# Patient Record
Sex: Male | Born: 1995 | Hispanic: Yes | Marital: Single | Smoking: Never smoker
Health system: Southern US, Community
[De-identification: ages and names within clinical notes are randomized; demographics above are authoritative.]

## PROBLEM LIST (undated history)

## (undated) DIAGNOSIS — E079 Disorder of thyroid, unspecified: Secondary | ICD-10-CM

---

## 2016-07-19 ENCOUNTER — Encounter (HOSPITAL_COMMUNITY): Payer: Self-pay

## 2016-07-19 ENCOUNTER — Emergency Department (HOSPITAL_COMMUNITY)
Admission: EM | Admit: 2016-07-19 | Discharge: 2016-07-19 | Disposition: A | Discharge status: Home / Self Care | Payer: Self-pay | Attending: Emergency Medicine | Admitting: Emergency Medicine

## 2016-07-19 DIAGNOSIS — H10211 Acute toxic conjunctivitis, right eye: Secondary | ICD-10-CM | POA: Insufficient documentation

## 2016-07-19 DIAGNOSIS — T65891A Toxic effect of other specified substances, accidental (unintentional), initial encounter: Secondary | ICD-10-CM | POA: Insufficient documentation

## 2016-07-19 MED ORDER — TETRACAINE HCL 0.5 % OP SOLN
2.0000 [drp] | Freq: Once | OPHTHALMIC | Status: AC
  Administered 2016-07-19: 2 [drp] via OPHTHALMIC
  Filled 2016-07-19: qty 4

## 2016-07-19 MED ORDER — FLUORESCEIN SODIUM 1 MG OP STRP
1.0000 | ORAL_STRIP | Freq: Once | OPHTHALMIC | Status: AC
  Administered 2016-07-19: 1 via OPHTHALMIC
  Filled 2016-07-19: qty 1

## 2016-07-19 MED ORDER — TETANUS-DIPHTH-ACELL PERTUSSIS 5-2.5-18.5 LF-MCG/0.5 IM SUSP
0.5000 mL | Freq: Once | INTRAMUSCULAR | Status: AC
  Administered 2016-07-19: 0.5 mL via INTRAMUSCULAR
  Filled 2016-07-19: qty 0.5

## 2016-07-19 MED ORDER — ERYTHROMYCIN 5 MG/GM OP OINT
TOPICAL_OINTMENT | Freq: Once | OPHTHALMIC | Status: AC
  Administered 2016-07-19: 1 via OPHTHALMIC
  Filled 2016-07-19: qty 3.5

## 2016-07-19 MED ORDER — ERYTHROMYCIN 5 MG/GM OP OINT: TOPICAL_OINTMENT | OPHTHALMIC | 0 refills | Status: DC

## 2016-07-19 NOTE — ED Triage Notes (Signed)
Pt presents with c/o eye problem. Pt reports that he was at work and got some chemicals in his eye when the products "exploded". Pt reports he has accelerant and super glue in his eye. Pt reports his eye is burning at this time. Pt's eye is red.

## 2016-07-19 NOTE — ED Notes (Signed)
Pt does not wear contact lenses or glasses

## 2016-07-19 NOTE — ED Provider Notes (Signed)
[facility]-EMERGENCY DEPT Provider Note   CSN: [id number] Arrival date & time: 07/19/16  1701     History   Chief Complaint Chief Complaint  Patient presents with  . Eye Problem    HPI Daniel Peters is a 20 y.o. male.  Daniel Peters is a 20 y.o. male with no pertinent past medical hx presents to ED with chemical exposure to eye. Patient reports he works for [employer] and was working with [employer] at work when it splashed into his right eye. He was not wearing safety glasses. Denies contact lenses or glasses use. He complains of eye redness, blurry vision, eye pain, watery eye discharge, and photophobia. He feels as though there is glue in his right eye. Patient also complains of mild left eye irritation and redness; he is unsure if any of the chemical material got into the left eye. He used an eyewash station at work for approximately 5-10 minutes with minimal relief. He denies fever, trouble swallowing, difficulty breathing, HA, LOC, abdominal pain, vomiting. Unknown last tetanus. No immunocompromising conditions.       History reviewed. No pertinent past medical history.  There are no active problems to display for this patient.   History reviewed. No pertinent surgical history.     Home Medications    Prior to Admission medications   Medication Sig Start Date End Date Taking? Authorizing Provider  erythromycin ophthalmic ointment Place a 1/2 inch ribbon of ointment into the lower eyelid four times daily 07/19/16   Lona KettleAshley Laurel Issaiah Seabrooks, PA-C    Family History No family history on file.  Social History Social History  Substance Use Topics  . Smoking status: Never Smoker  . Smokeless tobacco: Not on file  . Alcohol use No     Allergies   Patient has no known allergies.   Review of Systems Review of Systems  Constitutional: Negative for chills, diaphoresis and fever.  HENT: Negative for trouble swallowing.   Eyes: Positive for photophobia, pain, discharge,  redness and visual disturbance.  Respiratory: Negative for shortness of breath.   Cardiovascular: Negative for chest pain.  Gastrointestinal: Negative for abdominal pain, nausea and vomiting.  Genitourinary: Negative for dysuria and hematuria.  Musculoskeletal: Negative for neck pain.  Skin: Negative for rash.  Neurological: Negative for syncope and headaches.     Physical Exam Updated Vital Signs BP 126/70   Pulse 69   Temp 98 F (36.7 C) (Oral)   Resp 17   SpO2 97%   Physical Exam  Constitutional: He appears well-developed and well-nourished. No distress.  HENT:  Head: Normocephalic and atraumatic.  Mouth/Throat: Oropharynx is clear and moist. No oropharyngeal exudate.  Eyes: EOM are normal. Pupils are equal, round, and reactive to light. Right eye exhibits chemosis. Right eye exhibits no discharge and no hordeolum. Foreign body present in the right eye. Left eye exhibits no chemosis, no discharge and no hordeolum. No foreign body present in the left eye. Right conjunctiva is injected. Left conjunctiva is injected. Left conjunctiva has no hemorrhage. No scleral icterus.  Visual acuity is 20/40 bl/l, 20/100 R, 20/25 L uncorrected. No periorbital swelling. No proptosis. No eye lid lacerations. Lids are soft. No TTP or bony defects of orbital rim. PERRL.  Right eye: Conjunctival injection, chemosis, PH is 7-8. Three fine specs noted on cornea. On fluorescein stain - three fine specs noted; no abrasions, ulcerations, or dendritic lesions. No seidel sign.  Left eye: Conjunctival injection. PH 7-8.   Neck: Normal range of motion and  phonation normal. Neck supple. No neck rigidity. Normal range of motion present.  Cardiovascular: Normal rate, regular rhythm, normal heart sounds and intact distal pulses.   No murmur heard. Pulmonary/Chest: Effort normal and breath sounds normal. No stridor. No respiratory distress. He has no wheezes. He has no rales.  Abdominal: Soft. Bowel sounds are  normal. He exhibits no distension. There is no tenderness. There is no rigidity, no rebound, no guarding and no CVA tenderness.  Musculoskeletal: Normal range of motion.  Lymphadenopathy:    He has no cervical adenopathy.  Neurological: He is alert. He is not disoriented. Coordination and gait normal. GCS eye subscore is 4. GCS verbal subscore is 5. GCS motor subscore is 6.  Skin: Skin is warm and dry. He is not diaphoretic.  Psychiatric: He has a normal mood and affect. His behavior is normal.     ED Treatments / Results  Labs (all labs ordered are listed, but only abnormal results are displayed) Labs Reviewed - No data to display  EKG  EKG Interpretation None       Radiology No results found.  Procedures Procedures (including critical care time)  Medications Ordered in ED Medications  tetracaine (PONTOCAINE) 0.5 % ophthalmic solution 2 drop (2 drops Right Eye Given 07/19/16 1733)  fluorescein ophthalmic strip 1 strip (1 strip Right Eye Given 07/19/16 1733)  Tdap (BOOSTRIX) injection 0.5 mL (0.5 mLs Intramuscular Given 07/19/16 2053)  erythromycin ophthalmic ointment (1 application Right Eye Given 07/19/16 2052)     Initial Impression / Assessment and Plan / ED Course  I have reviewed the triage vital signs and the nursing notes.  Pertinent labs & imaging results that were available during my care of the patient were reviewed by me and considered in my medical decision making (see chart for details).  Clinical Course    Patient presents to ED with complaint of chemical exposure to eye. Patient is afebrile and non-toxic appearing in NAD. VSS. 20/40 b/l, 20/25 L, 20/100 R. PERRL. EOMs intact. B/l conjunctival injection R>L. No proptosis. Ph 7-8 b/l. Right chemosis. Three fine specs noted on right cornea - given sharp demarcation of specs suspect foreign body (instabond accelerator); no obvious corneal abrasion, ulceration, or dendritic lesions; no seidel sign. Given concern  for foreign body, and that the material is glue and very small will consult ophthalmology regarding removal. and noted to be glue  Spoke with poison control, recommend copious irrigation. Shared visit with Dr. Dalene SeltzerSchlossman, agrees with plan.   6:45 PM: Spoke with Dr. Allena KatzPatel. Start patient on erythromycin ointment and follow up in office on Monday @ 8:30am.   B/l eye irrigation performed with morgan lens. Improvement in ph noted. Pt states feels as though foreign bodies have moved into conjunctival sac. On re-assessment foreign body no longer present on cornea. Able to remove foreign bodies with relief to patient. Tetanus updated. Rx erythromycin ointment. Follow with ophthalmology on Monday. Return precautions given. Pt voiced understanding and is agreeable.   Final Clinical Impressions(s) / ED Diagnoses   Final diagnoses:  Chemical conjunctivitis of right eye    New Prescriptions Discharge Medication List as of 07/19/2016  8:43 PM    START taking these medications   Details  erythromycin ophthalmic ointment Place a 1/2 inch ribbon of ointment into the lower eyelid four times daily, Print         Lona Kettleshley Laurel Moneka Mcquinn, PA-C 07/20/16 1126    Alvira MondayErin Schlossman, MD 07/21/16 2122

## 2016-07-19 NOTE — Discharge Instructions (Signed)
Read the information below.  You had a chemical exposure to your eye. You are being prescribed topical antibiotics for your eye. Please use as directed.  You are scheduled to see the eye doctor on Monday at 8:30am, address and contact information is provided above.  Use the prescribed medication as directed.  Please discuss all new medications with your pharmacist.   You can take tylenol or motrin for pain relief.  You may return to the Emergency Department at any time for worsening condition or any new symptoms that concern you. Return to ED if symptoms worsen, have worsening changes in vision, unable to move eye, have purulent discharge, or any other new/concerning symptoms.

## 2017-08-05 ENCOUNTER — Observation Stay (HOSPITAL_COMMUNITY): Payer: 59

## 2017-08-05 ENCOUNTER — Observation Stay (HOSPITAL_COMMUNITY)
Admission: EM | Admit: 2017-08-05 | Discharge: 2017-08-06 | Disposition: A | Discharge status: Home / Self Care | Payer: 59 | Attending: Internal Medicine | Admitting: Internal Medicine

## 2017-08-05 DIAGNOSIS — R748 Abnormal levels of other serum enzymes: Secondary | ICD-10-CM

## 2017-08-05 DIAGNOSIS — R197 Diarrhea, unspecified: Secondary | ICD-10-CM | POA: Insufficient documentation

## 2017-08-05 DIAGNOSIS — R42 Dizziness and giddiness: Secondary | ICD-10-CM | POA: Insufficient documentation

## 2017-08-05 DIAGNOSIS — E860 Dehydration: Secondary | ICD-10-CM | POA: Insufficient documentation

## 2017-08-05 DIAGNOSIS — R112 Nausea with vomiting, unspecified: Secondary | ICD-10-CM | POA: Diagnosis not present

## 2017-08-05 DIAGNOSIS — R569 Unspecified convulsions: Secondary | ICD-10-CM | POA: Diagnosis not present

## 2017-08-05 DIAGNOSIS — R17 Unspecified jaundice: Secondary | ICD-10-CM

## 2017-08-05 DIAGNOSIS — Z7982 Long term (current) use of aspirin: Secondary | ICD-10-CM | POA: Diagnosis not present

## 2017-08-05 LAB — CBC
HEMATOCRIT: 45.4 % (ref 39.0–52.0)
HEMOGLOBIN: 16.1 g/dL (ref 13.0–17.0)
MCH: 31.3 pg (ref 26.0–34.0)
MCHC: 35.5 g/dL (ref 30.0–36.0)
MCV: 88.3 fL (ref 78.0–100.0)
Platelets: 200 10*3/uL (ref 150–400)
RBC: 5.14 MIL/uL (ref 4.22–5.81)
RDW: 12.5 % (ref 11.5–15.5)
WBC: 12.4 10*3/uL — ABNORMAL HIGH (ref 4.0–10.5)

## 2017-08-05 LAB — BASIC METABOLIC PANEL
ANION GAP: 10 (ref 5–15)
BUN: 19 mg/dL (ref 6–20)
CHLORIDE: 102 mmol/L (ref 101–111)
CO2: 25 mmol/L (ref 22–32)
Calcium: 9.4 mg/dL (ref 8.9–10.3)
Creatinine, Ser: 1.26 mg/dL — ABNORMAL HIGH (ref 0.61–1.24)
GFR calc Af Amer: >60 mL/min (ref >60)
GFR calc non Af Amer: >60 mL/min (ref >60)
GLUCOSE: 114 mg/dL — AB (ref 65–99)
Potassium: 4.7 mmol/L (ref 3.5–5.1)
Sodium: 137 mmol/L (ref 135–145)

## 2017-08-05 LAB — RAPID URINE DRUG SCREEN, HOSP PERFORMED
Amphetamines: NOT DETECTED
Barbiturates: NOT DETECTED
Benzodiazepines: POSITIVE — AB
Cocaine: NOT DETECTED
Opiates: NOT DETECTED
Tetrahydrocannabinol: NOT DETECTED

## 2017-08-05 LAB — HEPATIC FUNCTION PANEL
ALT: 97 U/L — ABNORMAL HIGH (ref 17–63)
AST: 47 U/L — ABNORMAL HIGH (ref 15–41)
Albumin: 4.9 g/dL (ref 3.5–5.0)
Alkaline Phosphatase: 92 U/L (ref 38–126)
Bilirubin, Direct: 0.3 mg/dL (ref 0.1–0.5)
Indirect Bilirubin: 2.2 mg/dL — ABNORMAL HIGH (ref 0.3–0.9)
Total Bilirubin: 2.5 mg/dL — ABNORMAL HIGH (ref 0.3–1.2)
Total Protein: 8.4 g/dL — ABNORMAL HIGH (ref 6.5–8.1)

## 2017-08-05 LAB — LIPASE, BLOOD: Lipase: 24 U/L (ref 11–51)

## 2017-08-05 LAB — TROPONIN I: Troponin I: <0.03 ng/mL (ref <0.03)

## 2017-08-05 LAB — CBG MONITORING, ED: GLUCOSE-CAPILLARY: 95 mg/dL (ref 65–99)

## 2017-08-05 MED ORDER — ONDANSETRON HCL 4 MG/2ML IJ SOLN: 4.0000 mg | Freq: Four times a day (QID) | INTRAMUSCULAR | Status: DC | PRN

## 2017-08-05 MED ORDER — ACETAMINOPHEN 650 MG RE SUPP: 650.0000 mg | Freq: Four times a day (QID) | RECTAL | Status: DC | PRN

## 2017-08-05 MED ORDER — LEVETIRACETAM 500 MG/5ML IV SOLN
1500.0000 mg | Freq: Once | INTRAVENOUS | Status: AC
  Administered 2017-08-05: 1500 mg via INTRAVENOUS
  Filled 2017-08-05: qty 15

## 2017-08-05 MED ORDER — PANTOPRAZOLE SODIUM 40 MG IV SOLR
40.0000 mg | Freq: Two times a day (BID) | INTRAVENOUS | Status: DC
  Administered 2017-08-05 – 2017-08-06 (×2): 40 mg via INTRAVENOUS
  Filled 2017-08-05 (×2): qty 40

## 2017-08-05 MED ORDER — SODIUM CHLORIDE 0.9 % IV SOLN
INTRAVENOUS | Status: DC
  Administered 2017-08-05: 1000 mL via INTRAVENOUS
  Administered 2017-08-06: 11:00:00 via INTRAVENOUS

## 2017-08-05 MED ORDER — ACETAMINOPHEN 325 MG PO TABS: 650.0000 mg | ORAL_TABLET | Freq: Four times a day (QID) | ORAL | Status: DC | PRN

## 2017-08-05 MED ORDER — ENOXAPARIN SODIUM 40 MG/0.4ML ~~LOC~~ SOLN
40.0000 mg | SUBCUTANEOUS | Status: DC
  Administered 2017-08-05: 40 mg via SUBCUTANEOUS
  Filled 2017-08-05: qty 0.4

## 2017-08-05 MED ORDER — LORAZEPAM 2 MG/ML IJ SOLN
INTRAMUSCULAR | Status: AC
  Filled 2017-08-05: qty 1

## 2017-08-05 MED ORDER — LORAZEPAM 2 MG/ML IJ SOLN
2.0000 mg | Freq: Once | INTRAMUSCULAR | Status: AC
  Administered 2017-08-05: 2 mg via INTRAVENOUS

## 2017-08-05 MED ORDER — IOPAMIDOL (ISOVUE-370) INJECTION 76%
INTRAVENOUS | Status: AC
  Administered 2017-08-05: 100 mL
  Filled 2017-08-05: qty 100

## 2017-08-05 MED ORDER — AMMONIA AROMATIC IN INHA
0.3000 mL | Freq: Once | RESPIRATORY_TRACT | Status: AC
  Administered 2017-08-05: 0.3 mL via RESPIRATORY_TRACT
  Filled 2017-08-05: qty 10

## 2017-08-05 MED ORDER — ONDANSETRON HCL 4 MG PO TABS: 4.0000 mg | ORAL_TABLET | Freq: Four times a day (QID) | ORAL | Status: DC | PRN

## 2017-08-05 NOTE — ED Notes (Signed)
Nurse drawing labs. 

## 2017-08-05 NOTE — Consult Note (Signed)
NEURO HOSPITALIST CONSULT NOTE   Requestig physician: Dr. Jacqulyn BathLong   Reason for Consult: Multiple seizures   History obtained from: Patient's girlfriend and ER HPI:                                                                                                                                          Daniel ParrDaniel Peters is an 21 y.o. male with no past medical history who states that he does not take any benzodiazepines, does not drink, does not do any illicit drugs, has no history of seizures, has no history of abnormal birth or head trauma.  Patient works as a [employer]at[employer].  Today he was working and noted that he began breathing heavily and felt like he was in a pass out the sat down.  At that point apparently he had a seizure which was witnessed by coworkers.  He had multiple seizures in route and per girlfriend were described as bilateral clonic with eyes closed but not responsive.  While in the ED patient had bilateral clonic seizure but eyes were open and did not respond to sternal rub.  In talking to the patient he states that he can hear people while he is having the seizure and he feels a seizure coming on when he breathes heavily.  There was one seizure in which she did not have a prolonged or even a postictal state while other seizures may be had a longer postictal state.  Currently he is awake and alert oriented and able to follow commands.  Of note patient and wife state he has been under significant stress at home due to finances, his job, and recently moving into a new apartment along with 2 children.  No past medical history on file.  No past surgical history on file.  No family history on file.    Social History:  reports that  has never smoked. He does not have any smokeless tobacco history on file. He reports that he does not drink alcohol or use drugs.  No Known Allergies  MEDICATIONS:                                                                                                                      Current Facility-Administered Medications  Medication Dose Route Frequency  Provider Last Rate Last Dose  . LORazepam (ATIVAN) 2 MG/ML injection            Current Outpatient Medications  Medication Sig Dispense Refill  . ibuprofen (ADVIL,MOTRIN) 200 MG tablet Take 200 mg by mouth every 6 (six) hours as needed.    Marland Kitchen erythromycin ophthalmic ointment Place a 1/2 inch ribbon of ointment into the lower eyelid four times daily (Patient not taking: Reported on 08/05/2017) 3.5 g 0      ROS:                                                                                                                                       History obtained from the patient  General ROS: negative for - chills, fatigue, fever, night sweats, weight gain or weight loss Psychological ROS: negative for - behavioral disorder, hallucinations, memory difficulties, mood swings or suicidal ideation Ophthalmic ROS: negative for - blurry vision, double vision, eye pain or loss of vision ENT ROS: negative for - epistaxis, nasal discharge, oral lesions, sore throat, tinnitus or vertigo Allergy and Immunology ROS: negative for - hives or itchy/watery eyes Hematological and Lymphatic ROS: negative for - bleeding problems, bruising or swollen lymph nodes Endocrine ROS: negative for - galactorrhea, hair pattern changes, polydipsia/polyuria or temperature intolerance Respiratory ROS: negative for - cough, hemoptysis, shortness of breath or wheezing Cardiovascular ROS: negative for - chest pain, dyspnea on exertion, edema or irregular heartbeat Gastrointestinal ROS: negative for - abdominal pain, diarrhea, hematemesis, nausea/vomiting or stool incontinence Genito-Urinary ROS: negative for - dysuria, hematuria, incontinence or urinary frequency/urgency Musculoskeletal ROS: negative for - joint swelling or muscular weakness Neurological ROS: as noted in HPI Dermatological ROS: negative for rash and  skin lesion changes   Blood pressure 111/73, pulse (!) 109, temperature 98.9 F (37.2 C), temperature source Oral, resp. rate (!) 22, height 5\' 9"  (1.753 m), weight 98.4 kg (217 lb), SpO2 98 %.   Neurologic Examination:                                                                                                      HEENT-  Normocephalic, no lesions, without obvious abnormality.  Normal external eye and conjunctiva.  Normal TM's bilaterally.  Normal auditory canals and external ears. Normal external nose, mucus membranes and septum.  Normal pharynx. Cardiovascular- S1, S2 normal, pulses palpable throughout   Lungs- chest clear, no wheezing, rales, normal symmetric air entry Abdomen- normal findings: bowel sounds normal Extremities- no edema  Lymph-no adenopathy palpable Musculoskeletal-no joint tenderness, deformity or swelling Skin-warm and dry, no hyperpigmentation, vitiligo, or suspicious lesions  Neurological Examination Mental Status: Alert, oriented, thought content appropriate.  Speech fluent without evidence of aphasia.  Able to follow 3 step commands without difficulty. Cranial Nerves: II: Discs flat bilaterally; Visual fields grossly normal,  III,IV, VI: ptosis not present, extra-ocular motions intact bilaterally pupils equal, round, reactive to light and accommodation V,VII: smile symmetric, facial light touch sensation normal bilaterally VIII: hearing normal bilaterally IX,X: uvula rises symmetrically XI: bilateral shoulder shrug XII: midline tongue extension Motor: Right : Upper extremity   5/5    Left:     Upper extremity   5/5  Lower extremity   5/5     Lower extremity   5/5 Tone and bulk:normal tone throughout; no atrophy noted Sensory: Pinprick and light touch intact throughout, bilaterally Deep Tendon Reflexes: 2+ and symmetric throughout Plantars: Right: downgoing   Left: downgoing Cerebellar: normal finger-to-nose, normal rapid alternating movements and  normal heel-to-shin test Gait: Not tested      Lab Results: Basic Metabolic Panel: Recent Labs  Lab 08/05/17 1407  NA 137  K 4.7  CL 102  CO2 25  GLUCOSE 114*  BUN 19  CREATININE 1.26*  CALCIUM 9.4    Liver Function Tests: Recent Labs  Lab 08/05/17 1407  AST 47*  ALT 97*  ALKPHOS 92  BILITOT 2.5*  PROT 8.4*  ALBUMIN 4.9   Recent Labs  Lab 08/05/17 1407  LIPASE 24   No results for input(s): AMMONIA in the last 168 hours.  CBC: Recent Labs  Lab 08/05/17 1407  WBC 12.4*  HGB 16.1  HCT 45.4  MCV 88.3  PLT 200    Cardiac Enzymes: No results for input(s): CKTOTAL, CKMB, CKMBINDEX, TROPONINI in the last 168 hours.  Lipid Panel: No results for input(s): CHOL, TRIG, HDL, CHOLHDL, VLDL, LDLCALC in the last 168 hours.  CBG: Recent Labs  Lab 08/05/17 1503  GLUCAP 95    Microbiology: No results found for this or any previous visit.  Coagulation Studies: No results for input(s): LABPROT, INR in the last 72 hours.  Imaging: No results found.     Assessment and plan per attending neurologist  Felicie Morn PA-C [facility] [phone]  08/05/2017, 4:16 PM   Assessment/Plan: This is a 21 year old male with no past medical history of seizures as noted above.  Has had a flurry of seizures 6 total today.  Denies any illicit drug use or alcohol.  The semiology of the seizures are variable, some of them with a eyes open and others with eyes closed shut tight.  The seizure that was witnessed by the healthcare professionals here at [facility] was described as clonic with eyes open however 1 of these had a very short postictal state.  Patient has no tongue biting nor does he have any trauma to his head or urinary incontinence.  Patient was able to note he was going to have a seizure each time by heavily breathing and was able to get himself down to the floor.   Recommend: -Admission to hospital -EEG and likely LTM overnight to capture multiple  seizures -If EEG shows positive for epileptiform activity continue to treat with Keppra 500 mg twice daily.   NEUROHOSPITALIST ADDENDUM Seen and examined the patient this AM. Formulated plan as documented above. Recommendations as above. Will follow.   10 y male with no past medical history of seizures presents with multiple seizures. Description of seizures  was variable and there is a suspicion these spells are nonepileptic as patient has been under increased psychiatric stress. Patient does not appear to be in subclinical status. Long-term EEG was started to capture any spells. Recommend not starting the patient on any antiepileptics at this time. If no events are captured, then we'll start the patient on Keppra and have him follow-up with outpatient. It came to my attention the patient still had not had any head imaging. We can obtain a CT head while the wires were connected.  Georgiana SpinnerSushanth Shervon Kerwin MD [facility] [phone]  If 7pm to 7am, please call on call as listed on AMION.

## 2017-08-05 NOTE — ED Triage Notes (Signed)
Per EMS Pt has a seizure while at work.  He was witnessed falling to the ground.  Witnessed stated that he did not hit head.  He did vomit red blood and was lying in it when EMS arrived.  He was given 5mg  of Versed IM in route, and had an additional seizure that lasted 2 min with ems.  Pt is Alert and Oriented x4 NAD noted at this time.

## 2017-08-05 NOTE — H&P (Signed)
History and Physical    Daniel Peters ZOX:[id number] DOB: 01/29/1996 DOA: 08/05/2017  PCP: Patient, No Pcp Per  Patient coming from: Home   I have personally briefly reviewed [facility]  Chief Complaint: Seizure.   HPI: Daniel ParrDaniel Teaster is a 21 y.o. male with no medical past history who presents after seizure episodes. He had 2 witness seizure in the ED, tonic clonic, eyes open but unresponsive. And 4 others by co-worker. Patient has been sleepy, he wake up answer some questions. He report chest pain, on and off since Sunday. He feels SOB and then chest pain. Worse with movement. He is also complaining of epigastric pain. Per fiance, he was complaining of abdominal pain, diarrhea, chest pain. Per family he has been confuse after last seizure.   ED Course: had two seizure episodes in the ED. Received IV ativan and Keppra. Neurology consulted recommending EEG, admission for observation.   Review of Systems: As per HPI otherwise 10 point review of systems negative.    PMH; no PMH.   Past surgical history; no Sx.    reports that  has never smoked. He does not have any smokeless tobacco history on file. He reports that he does not drink alcohol or use drugs.  No Known Allergies  Family History ; father is healthy, Mother has DM.    Prior to Admission medications   Medication Sig Start Date End Date Taking? Authorizing Provider  ibuprofen (ADVIL,MOTRIN) 200 MG tablet Take 200 mg by mouth every 6 (six) hours as needed.   Yes [provider]  erythromycin ophthalmic ointment Place a 1/2 inch ribbon of ointment into the lower eyelid four times daily Patient not taking: Reported on 08/05/2017 07/19/16   Deborha PaymentMeyer, Ashley L, PA-C    Physical Exam: Vitals:   08/05/17 1530 08/05/17 1545 08/05/17 1600 08/05/17 1615  BP: 116/61 101/63 (!) 104/55 (!) 104/54  Pulse: (!) 101 100 (!) 104 100  Resp: 18 12 14 16   Temp:      TempSrc:      SpO2: 98% 100% 97%  99%  Weight:      Height:        Constitutional: NAD, calm, comfortable, sleepy  Vitals:   08/05/17 1530 08/05/17 1545 08/05/17 1600 08/05/17 1615  BP: 116/61 101/63 (!) 104/55 (!) 104/54  Pulse: (!) 101 100 (!) 104 100  Resp: 18 12 14 16   Temp:      TempSrc:      SpO2: 98% 100% 97% 99%  Weight:      Height:       Eyes: PERRL, lids and conjunctivae normal ENMT: Mucous membranes are moist. Posterior pharynx clear of any exudate or lesions.Normal dentition.  Neck: normal, supple, no masses, no thyromegaly Respiratory: clear to auscultation bilaterally, no wheezing, no crackles. Normal respiratory effort. No accessory muscle use.  Cardiovascular: Regular rate and rhythm, no murmurs / rubs / gallops. No extremity edema. 2+ pedal pulses. No carotid bruits.  Abdomen: mild epigastric  tenderness, no masses palpated. No hepatosplenomegaly. Bowel sounds positive.  Musculoskeletal: no clubbing / cyanosis. No joint deformity upper and lower extremities. Good ROM, no contractures. Normal muscle tone.  Skin: no rashes, lesions, ulcers. No induration Neurologic: CN 2-12 grossly intact. Sleepy, moves all 4 extremities.  Psychiatric:unable to assess.   Labs on Admission: I have personally reviewed following labs and imaging studies  CBC: Recent Labs  Lab 08/05/17 1407  WBC 12.4*  HGB 16.1  HCT 45.4  MCV 88.3  PLT 200   Basic Metabolic Panel: Recent Labs  Lab 08/05/17 1407  NA 137  K 4.7  CL 102  CO2 25  GLUCOSE 114*  BUN 19  CREATININE 1.26*  CALCIUM 9.4   GFR: Estimated Creatinine Clearance: 106.4 mL/min (A) (by C-G formula based on SCr of 1.26 mg/dL (H)). Liver Function Tests: Recent Labs  Lab 08/05/17 1407  AST 47*  ALT 97*  ALKPHOS 92  BILITOT 2.5*  PROT 8.4*  ALBUMIN 4.9   Recent Labs  Lab 08/05/17 1407  LIPASE 24   No results for input(s): AMMONIA in the last 168 hours. Coagulation Profile: No results for input(s): INR, PROTIME in the last 168  hours. Cardiac Enzymes: No results for input(s): CKTOTAL, CKMB, CKMBINDEX, TROPONINI in the last 168 hours. BNP (last 3 results) No results for input(s): PROBNP in the last 8760 hours. HbA1C: No results for input(s): HGBA1C in the last 72 hours. CBG: Recent Labs  Lab 08/05/17 1503  GLUCAP 95   Lipid Profile: No results for input(s): CHOL, HDL, LDLCALC, TRIG, CHOLHDL, LDLDIRECT in the last 72 hours. Thyroid Function Tests: No results for input(s): TSH, T4TOTAL, FREET4, T3FREE, THYROIDAB in the last 72 hours. Anemia Panel: No results for input(s): VITAMINB12, FOLATE, FERRITIN, TIBC, IRON, RETICCTPCT in the last 72 hours. Urine analysis: No results found for: COLORURINE, APPEARANCEUR, LABSPEC, PHURINE, GLUCOSEU, HGBUR, BILIRUBINUR, KETONESUR, PROTEINUR, UROBILINOGEN, NITRITE, LEUKOCYTESUR  Radiological Exams on Admission: No results found.  EKG: sinus tachycardia.   Assessment/Plan Active Problems:   Seizure (HCC)  1-Questionable seizure; descriptions of spell per neurology was variable. Plan for continue EEG and monitoring.  IV fluids.  Received ativan and keppra in the ED.  UDS ordered.   2-Transaminases; unclear etiology. Check RUQ US and hepatitis panel.   3-Chest pain, tachycardia, dyspnea.  Concern with PE, will check CT angio.  EKG. Cycle enzymes.   4-Confusion; monitor. EEG.   5-Leukocytosis; repeat in am.   DVT prophylaxis: Lovenox.  Code Status: full code.  Family Communication: fiance and father at bedside.  Disposition Plan: Home when stable.  Consults called: neurology  Admission status: observation.   Alba CoryBelkys A Regalado MD [facility] Pager [phone]  If 7PM-7AM, please contact night-coverage [email or web address] Password [email or web address]  08/05/2017, 5:45 PM

## 2017-08-05 NOTE — ED Notes (Signed)
Admitting at bedside 

## 2017-08-05 NOTE — ED Notes (Addendum)
After this pt last seizure he was alert and oriented. The past couple hours this pt has been sleeping and per family pt is resting and then will wake up and ask where he is and the fall back asleep. EDP made aware.

## 2017-08-05 NOTE — ED Provider Notes (Signed)
[facility] EMERGENCY DEPARTMENT Provider Note   CSN: [id number] Arrival date & time: 08/05/17  1348     History   Chief Complaint Chief Complaint  Patient presents with  . Seizures    HPI Daniel Peters is a 21 y.o. male who is previously healthy who presents with seizure-like activity from work today.  Patient reports that he had diarrhea several times at work today.  He came out of the bathroom and felt very lightheaded, began feeling nauseated, and vomited.  Shortly after he vomited, he began shaking all over his body, per to people he works with.  He then stopped and started again a few times.  Afterwards, he was disoriented for a few minutes.  He has no seizure history.  Patient's denies any drug or alcohol use.  He notes that he has felt short of breath sometimes for the past few weeks.  His fiance states that she has noticed that his eyes are yellow sometimes.  He denies any pain at this time.  He reports that he just feels weak and tired. No incontinence.  HPI  No past medical history on file.  Patient Active Problem List   Diagnosis Date Noted  . Seizure (HCC) 08/05/2017    No past surgical history on file.     Home Medications    Prior to Admission medications   Medication Sig Start Date End Date Taking? Authorizing Provider  ibuprofen (ADVIL,MOTRIN) 200 MG tablet Take 200 mg by mouth every 6 (six) hours as needed.   Yes [provider]  erythromycin ophthalmic ointment Place a 1/2 inch ribbon of ointment into the lower eyelid four times daily Patient not taking: Reported on 08/05/2017 07/19/16   Deborha Payment, PA-C    Family History No family history on file.  Social History Social History   Tobacco Use  . Smoking status: Never Smoker  Substance Use Topics  . Alcohol use: No  . Drug use: No     Allergies   Patient has no known allergies.   Review of Systems Review of Systems  Constitutional: Negative for chills and  fever.  HENT: Negative for facial swelling and sore throat.   Respiratory: Negative for shortness of breath.   Cardiovascular: Negative for chest pain.  Gastrointestinal: Positive for diarrhea, nausea and vomiting. Negative for abdominal pain.  Genitourinary: Negative for dysuria.  Musculoskeletal: Negative for back pain.  Skin: Negative for rash and wound.  Neurological: Positive for seizures and light-headedness. Negative for headaches.  Psychiatric/Behavioral: The patient is not nervous/anxious.      Physical Exam Updated Vital Signs BP (!) 104/54   Pulse 100   Temp 98.9 F (37.2 C) (Oral)   Resp 16   Ht 5\' 9"  (1.753 m)   Wt 98.4 kg (217 lb)   SpO2 99%   BMI 32.05 kg/m   Physical Exam  Constitutional: He appears well-developed and well-nourished. No distress.  HENT:  Head: Normocephalic and atraumatic.  Mouth/Throat: Oropharynx is clear and moist. No oropharyngeal exudate.  No tongue trauma  Eyes: Conjunctivae and EOM are normal. Pupils are equal, round, and reactive to light. Right eye exhibits no discharge. Left eye exhibits no discharge. No scleral icterus.  Neck: Normal range of motion. Neck supple. No thyromegaly present.  Cardiovascular: Normal rate, regular rhythm, normal heart sounds and intact distal pulses. Exam reveals no gallop and no friction rub.  No murmur heard. Pulmonary/Chest: Effort normal and breath sounds normal. No stridor. No respiratory distress.  He has no wheezes. He has no rales.  Abdominal: Soft. Bowel sounds are normal. He exhibits no distension. There is no tenderness. There is no rebound and no guarding.  Musculoskeletal: He exhibits no edema.  Lymphadenopathy:    He has no cervical adenopathy.  Neurological: He is alert. Coordination normal.  CN 3-12 intact; normal sensation throughout; 5/5 strength in all 4 extremities; equal bilateral grip strength  Skin: Skin is warm and dry. No rash noted. He is not diaphoretic. No pallor.    Psychiatric: He has a normal mood and affect.  Nursing note and vitals reviewed.    ED Treatments / Results  Labs (all labs ordered are listed, but only abnormal results are displayed) Labs Reviewed  BASIC METABOLIC PANEL - Abnormal; Notable for the following components:      Result Value   Glucose, Bld 114 (*)    Creatinine, Ser 1.26 (*)    All other components within normal limits  CBC - Abnormal; Notable for the following components:   WBC 12.4 (*)    All other components within normal limits  HEPATIC FUNCTION PANEL - Abnormal; Notable for the following components:   Total Protein 8.4 (*)    AST 47 (*)    ALT 97 (*)    Total Bilirubin 2.5 (*)    Indirect Bilirubin 2.2 (*)    All other components within normal limits  LIPASE, BLOOD  RAPID URINE DRUG SCREEN, HOSP PERFORMED  CBG MONITORING, ED    EKG  EKG Interpretation  Date/Time:  Wednesday August 05 2017 13:58:57 EST Ventricular Rate:  109 PR Interval:    QRS Duration: 84 QT Interval:  296 QTC Calculation: 399 R Axis:   99 Text Interpretation:  Sinus tachycardia Borderline right axis deviation No STEMI.  Confirmed by Alona BeneLong, Joshua [phone]([id number]) on 08/05/2017 2:02:59 PM       Radiology No results found.  Procedures Procedures (including critical care time)  Medications Ordered in ED Medications  LORazepam (ATIVAN) 2 MG/ML injection (not administered)  LORazepam (ATIVAN) injection 2 mg (2 mg Intravenous Given 08/05/17 1454)  levETIRAcetam (KEPPRA) 1,500 mg in sodium chloride 0.9 % 100 mL IVPB (0 mg Intravenous Stopped 08/05/17 1610)  ammonia inhalant 0.3 mL (0.3 mLs Inhalation Given 08/05/17 1749)     Initial Impression / Assessment and Plan / ED Course  I have reviewed the triage vital signs and the nursing notes.  Pertinent labs & imaging results that were available during my care of the patient were reviewed by me and considered in my medical decision making (see chart for details).     Patient had at  least 3 episodes of full body seizures, tonic-clonic, that lasted about 20 seconds each.  Prior to seizure activity, patient became tachypneic.  Patient continued to be tachypneic and began tearing up during the seizures.  Ativan 2 mg was given after the first, however 2 more episodes followed.  Patient then loaded with Keppra 1500 mg.  I consulted neurology and spoke with Dr. Laurence SlateAroor who advised that his physician assistant would come to see the patient and give recommendations.  Patient with mildly elevated WBC, 12.4.  Elevated AST and ALT, 47 and 97 respectively.  Patient also has a 2.5 total bilirubin.  I made admitting team aware of this and may prompt further workup inpatient.  We do not feel that this would be a contributor to patient's seizure activity.  Neurology recommends EEG and hospital admission, which was set up in the ED.  Neurology would like to see seizure activity on EEG before initiating treatment. CT head, UDS pending. I consulted [facility] and spoke with Dr. Sunnie Nielsenegalado who will admit the patient for further evaluation and treatment. Patient also evaluated by Dr. Jacqulyn BathLong who guided the patient's management and agrees with plan.   Final Clinical Impressions(s) / ED Diagnoses   Final diagnoses:  Seizure-like activity [facility](HCC)    ED Discharge Orders    None       Emi HolesLaw, Shakita Keir M, [place]PA-C 08/05/17 1750    Maia PlanLong, Joshua G, MD 08/06/17 1055

## 2017-08-05 NOTE — Progress Notes (Signed)
STAT LTM started; nurse and family educated on event button. Nurse also advised of how to move the equipment once patient gets a room. Dr Dorthea CoveSass notified.

## 2017-08-06 ENCOUNTER — Encounter (HOSPITAL_COMMUNITY): Payer: Self-pay

## 2017-08-06 ENCOUNTER — Other Ambulatory Visit: Payer: Self-pay

## 2017-08-06 DIAGNOSIS — R197 Diarrhea, unspecified: Secondary | ICD-10-CM | POA: Diagnosis not present

## 2017-08-06 DIAGNOSIS — A09 Infectious gastroenteritis and colitis, unspecified: Secondary | ICD-10-CM

## 2017-08-06 DIAGNOSIS — R569 Unspecified convulsions: Secondary | ICD-10-CM

## 2017-08-06 DIAGNOSIS — R17 Unspecified jaundice: Secondary | ICD-10-CM

## 2017-08-06 DIAGNOSIS — R748 Abnormal levels of other serum enzymes: Secondary | ICD-10-CM

## 2017-08-06 DIAGNOSIS — E860 Dehydration: Secondary | ICD-10-CM

## 2017-08-06 LAB — GASTROINTESTINAL PANEL BY PCR, STOOL (REPLACES STOOL CULTURE)
Adenovirus F40/41: NOT DETECTED
Astrovirus: NOT DETECTED
CRYPTOSPORIDIUM: NOT DETECTED
Campylobacter species: NOT DETECTED
Cyclospora cayetanensis: NOT DETECTED
ENTAMOEBA HISTOLYTICA: NOT DETECTED
ENTEROAGGREGATIVE E COLI (EAEC): NOT DETECTED
Enteropathogenic E coli (EPEC): NOT DETECTED
Enterotoxigenic E coli (ETEC): NOT DETECTED
GIARDIA LAMBLIA: NOT DETECTED
NOROVIRUS GI/GII: DETECTED — AB
Plesimonas shigelloides: NOT DETECTED
Rotavirus A: NOT DETECTED
SALMONELLA SPECIES: NOT DETECTED
SHIGELLA/ENTEROINVASIVE E COLI (EIEC): NOT DETECTED
Sapovirus (I, II, IV, and V): NOT DETECTED
Shiga like toxin producing E coli (STEC): NOT DETECTED
VIBRIO CHOLERAE: NOT DETECTED
VIBRIO SPECIES: NOT DETECTED
YERSINIA ENTEROCOLITICA: NOT DETECTED

## 2017-08-06 LAB — COMPREHENSIVE METABOLIC PANEL
ALT: 72 U/L — AB (ref 17–63)
AST: 35 U/L (ref 15–41)
Albumin: 4.1 g/dL (ref 3.5–5.0)
Alkaline Phosphatase: 66 U/L (ref 38–126)
Anion gap: 10 (ref 5–15)
BILIRUBIN TOTAL: 3.2 mg/dL — AB (ref 0.3–1.2)
BUN: 17 mg/dL (ref 6–20)
CO2: 23 mmol/L (ref 22–32)
CREATININE: 1.21 mg/dL (ref 0.61–1.24)
Calcium: 8.8 mg/dL — ABNORMAL LOW (ref 8.9–10.3)
Chloride: 106 mmol/L (ref 101–111)
Glucose, Bld: 96 mg/dL (ref 65–99)
POTASSIUM: 3.4 mmol/L — AB (ref 3.5–5.1)
Sodium: 139 mmol/L (ref 135–145)
TOTAL PROTEIN: 6.6 g/dL (ref 6.5–8.1)

## 2017-08-06 LAB — CBC
HCT: 40.9 % (ref 39.0–52.0)
Hemoglobin: 13.8 g/dL (ref 13.0–17.0)
MCH: 29.9 pg (ref 26.0–34.0)
MCHC: 33.7 g/dL (ref 30.0–36.0)
MCV: 88.7 fL (ref 78.0–100.0)
PLATELETS: 198 10*3/uL (ref 150–400)
RBC: 4.61 MIL/uL (ref 4.22–5.81)
RDW: 12.7 % (ref 11.5–15.5)
WBC: 6.7 10*3/uL (ref 4.0–10.5)

## 2017-08-06 LAB — TSH: TSH: 9.533 u[IU]/mL — ABNORMAL HIGH (ref 0.350–4.500)

## 2017-08-06 LAB — TROPONIN I: Troponin I: <0.03 ng/mL (ref <0.03)

## 2017-08-06 LAB — HIV ANTIBODY (ROUTINE TESTING W REFLEX): HIV Screen 4th Generation wRfx: NONREACTIVE

## 2017-08-06 MED ORDER — INFLUENZA VAC SPLIT QUAD 0.5 ML IM SUSY: 0.5000 mL | PREFILLED_SYRINGE | INTRAMUSCULAR | Status: DC

## 2017-08-06 MED ORDER — POTASSIUM CHLORIDE CRYS ER 20 MEQ PO TBCR
40.0000 meq | EXTENDED_RELEASE_TABLET | Freq: Once | ORAL | Status: AC
  Administered 2017-08-06: 40 meq via ORAL
  Filled 2017-08-06: qty 2

## 2017-08-06 NOTE — Progress Notes (Signed)
LTM EEG was D/C'd per Dr Laurence SlateAroor. No skin breakdown noted. Pt expressed no complaints or concerns.

## 2017-08-06 NOTE — Discharge Summary (Signed)
Physician Discharge Summary  Daniel Peters Abid PIR:[id number] DOB: 11/06/1995 DOA: 08/05/2017  PCP: Patient, No Pcp Per  Admit date: 08/05/2017 Discharge date: 08/06/2017   Recommendations for Outpatient Follow-Up:   1. Follow up repeat TSH 2. Follow up LFTs   Discharge Diagnosis:   Active Problems:   Convulsion, non-epileptic (HCC)   Dehydration   Diarrhea   Elevated bilirubin   Discharge disposition:  Home.  Discharge Condition: Improved.  Diet recommendation:Regular.  Wound care: None.   History of Present Illness:   Daniel Peters Hymas is a 21 y.o. male with no medical past history who presents after seizure episodes. He had 2 witness seizure in the ED, tonic clonic, eyes open but unresponsive. And 4 others by co-worker. Patient has been sleepy, he wake up answer some questions. He report chest pain, on and off since Sunday. He feels SOB and then chest pain. Worse with movement. He is also complaining of epigastric pain. Per fiance, he was complaining of abdominal pain, diarrhea, chest pain. Per family he has been confuse after last seizure.      Hospital Course by Problem:  Suspect syncope due to hypotension -responded to IVF  norovirus -symptomatic care -feeling much better, asking to be d/c'd  psudoseizures -no driving for 6 months -outpatient counselling  Elevated LFTs -outpatient follow up -RUQ ok -suspect due to viral illness    Medical Consultants:    None.   Discharge Exam:   Vitals:   08/06/17 1001 08/06/17 1320  BP: 103/61 118/71  Pulse: 96 84  Resp: 18 18  Temp: 98.6 F (37 C) 99.4 F (37.4 C)  SpO2: 98% 98%   Vitals:   08/06/17 0144 08/06/17 0515 08/06/17 1001 08/06/17 1320  BP: 114/70 106/65 103/61 118/71  Pulse: (!) 108 90 96 84  Resp: 16 18 18 18   Temp: 99.5 F (37.5 C) 98.9 F (37.2 C) 98.6 F (37 C) 99.4 F (37.4 C)  TempSrc: Oral Oral Oral Oral  SpO2: 96% 98% 98% 98%  Weight:      Height:        Gen:  NAD- feeling  better, asking to go home   The results of significant diagnostics from this hospitalization (including imaging, microbiology, ancillary and laboratory) are listed below for reference.     Procedures and Diagnostic Studies:   Ct Head Wo Contrast  Result Date: 08/05/2017 CLINICAL DATA:  Seizure.  New.  Nontraumatic. EXAM: CT HEAD WITHOUT CONTRAST TECHNIQUE: Contiguous axial images were obtained from the base of the skull through the vertex without intravenous contrast. COMPARISON:  None. FINDINGS: Brain: EEG leads creates some degree of artifact. No acute infarct, hemorrhage, or mass lesion is present. The ventricles are of normal size. No significant extraaxial fluid collection is present. Vascular: No hyperdense vessel or unexpected calcification. Skull: The calvarium is intact. No focal lytic or blastic lesions are present. Sinuses/Orbits: The paranasal sinuses and mastoid air cells are clear. The globes and orbits are within normal limits. IMPRESSION: Negative CT of the head. Electronically Signed   By: Marin Robertshristopher  Mattern M.D.   On: 08/05/2017 19:19   Ct Angio Chest Pe W Or Wo Contrast  Result Date: 08/05/2017 CLINICAL DATA:  Shortness of breath. EXAM: CT ANGIOGRAPHY CHEST WITH CONTRAST TECHNIQUE: Multidetector CT imaging of the chest was performed using the standard protocol during bolus administration of intravenous contrast. Multiplanar CT image reconstructions and MIPs were obtained to evaluate the vascular anatomy. CONTRAST:  59 cc Isovue 370 intravenously COMPARISON:  None. FINDINGS: Cardiovascular:  Satisfactory opacification of the pulmonary arteries to the segmental level. No evidence of pulmonary embolism. Normal heart size. No pericardial effusion. Mediastinum/Nodes: No enlarged mediastinal, hilar, or axillary lymph nodes. Thyroid gland, trachea, and esophagus demonstrate no significant findings. Lungs/Pleura: Lungs are clear. No pleural effusion or pneumothorax. Hypoventilatory changes  with dependent distribution. Prominence of the interstitial markings may be seen with interstitial pulmonary edema. Upper Abdomen: No acute abnormality. Musculoskeletal: No chest wall abnormality. No acute or significant osseous findings. Review of the MIP images confirms the above findings. IMPRESSION: No evidence of pulmonary embolus or other significant vascular findings. Hypoventilatory changes. Possible development of interstitial pulmonary edema. Electronically Signed   By: Ted Mcalpine M.D.   On: 08/05/2017 19:26   US Abdomen Limited Ruq  Result Date: 08/06/2017 CLINICAL DATA:  Abnormal transaminases. EXAM: ULTRASOUND ABDOMEN LIMITED RIGHT UPPER QUADRANT COMPARISON:  Body CT 12/21/2013 FINDINGS: Gallbladder: No gallstones or wall thickening visualized. No sonographic Murphy sign noted by sonographer. Common bile duct: Diameter: 3.3 mm Liver: No focal lesion identified. Increased parenchymal echogenicity. Portal vein is patent on color Doppler imaging with normal direction of blood flow towards the liver. IMPRESSION: Normal appearance of the urinary bladder. Increased echogenicity of the liver, which may be seen with hepatic steatosis or fibrosis. Electronically Signed   By: Ted Mcalpine M.D.   On: 08/06/2017 00:05     Labs:   Basic Metabolic Panel: Recent Labs  Lab 08/05/17 1407 08/06/17 0641  NA 137 139  K 4.7 3.4*  CL 102 106  CO2 25 23  GLUCOSE 114* 96  BUN 19 17  CREATININE 1.26* 1.21  CALCIUM 9.4 8.8*   GFR Estimated Creatinine Clearance: 110.8 mL/min (by C-G formula based on SCr of 1.21 mg/dL). Liver Function Tests: Recent Labs  Lab 08/05/17 1407 08/06/17 0641  AST 47* 35  ALT 97* 72*  ALKPHOS 92 66  BILITOT 2.5* 3.2*  PROT 8.4* 6.6  ALBUMIN 4.9 4.1   Recent Labs  Lab 08/05/17 1407  LIPASE 24   No results for input(s): AMMONIA in the last 168 hours. Coagulation profile No results for input(s): INR, PROTIME in the last 168 hours.  CBC: Recent  Labs  Lab 08/05/17 1407 08/06/17 0641  WBC 12.4* 6.7  HGB 16.1 13.8  HCT 45.4 40.9  MCV 88.3 88.7  PLT 200 198   Cardiac Enzymes: Recent Labs  Lab 08/05/17 1920 08/06/17 0015 08/06/17 0641  TROPONINI <0.03 <0.03 <0.03   BNP: Invalid input(s): POCBNP CBG: Recent Labs  Lab 08/05/17 1503  GLUCAP 95   D-Dimer No results for input(s): DDIMER in the last 72 hours. Hgb A1c No results for input(s): HGBA1C in the last 72 hours. Lipid Profile No results for input(s): CHOL, HDL, LDLCALC, TRIG, CHOLHDL, LDLDIRECT in the last 72 hours. Thyroid function studies Recent Labs    08/06/17 1158  TSH 9.533*   Anemia work up No results for input(s): VITAMINB12, FOLATE, FERRITIN, TIBC, IRON, RETICCTPCT in the last 72 hours. Microbiology Recent Results (from the past 240 hour(s))  Gastrointestinal Panel by PCR , Stool     Status: Abnormal   Collection Time: 08/06/17 12:16 PM  Result Value Ref Range Status   Campylobacter species NOT DETECTED NOT DETECTED Final   Plesimonas shigelloides NOT DETECTED NOT DETECTED Final   Salmonella species NOT DETECTED NOT DETECTED Final   Yersinia enterocolitica NOT DETECTED NOT DETECTED Final   Vibrio species NOT DETECTED NOT DETECTED Final   Vibrio cholerae NOT DETECTED NOT DETECTED Final  Enteroaggregative E coli (EAEC) NOT DETECTED NOT DETECTED Final   Enteropathogenic E coli (EPEC) NOT DETECTED NOT DETECTED Final   Enterotoxigenic E coli (ETEC) NOT DETECTED NOT DETECTED Final   Shiga like toxin producing E coli (STEC) NOT DETECTED NOT DETECTED Final   Shigella/Enteroinvasive E coli (EIEC) NOT DETECTED NOT DETECTED Final   Cryptosporidium NOT DETECTED NOT DETECTED Final   Cyclospora cayetanensis NOT DETECTED NOT DETECTED Final   Entamoeba histolytica NOT DETECTED NOT DETECTED Final   Giardia lamblia NOT DETECTED NOT DETECTED Final   Adenovirus F40/41 NOT DETECTED NOT DETECTED Final   Astrovirus NOT DETECTED NOT DETECTED Final   Norovirus  GI/GII DETECTED (A) NOT DETECTED Final    Comment: RESULT CALLED TO, READ BACK BY AND VERIFIED WITH: MIRANDA SURLUS 08/06/17 1708 KLW    Rotavirus A NOT DETECTED NOT DETECTED Final   Sapovirus (I, II, IV, and V) NOT DETECTED NOT DETECTED Final     Discharge Instructions:   Discharge Instructions    Diet general   Complete by:  As directed    Discharge instructions   Complete by:  As directed    Establish with PCP as your liver enzymes were abnormal-- most likely from the norovirus but you will need repeat CMP in 2 weeks See attached information about norovirus Also, per [place] stats law, no driving until seizure/spell free for 6 months You may return to work once no longer having liquid stools   Increase activity slowly   Complete by:  As directed      Allergies as of 08/06/2017   No Known Allergies     Medication List    STOP taking these medications   erythromycin ophthalmic ointment     TAKE these medications   ibuprofen 200 MG tablet Commonly known as:  ADVIL,MOTRIN Take 200 mg by mouth every 6 (six) hours as needed.         Time coordinating discharge: 35min  Signed:  Joseph ArtJessica U Lorelai Huyser   [facility] 08/06/2017, 5:48 PM

## 2017-08-06 NOTE — Care Management Note (Signed)
Case Management Note  Patient Details  Name: Daniel ParrDaniel Peters MRN: [id number] Date of Birth: 07/30/1996  Subjective/Objective:    Pt in with seizures. He is from home with fiance.   No PCP listed.             Action/Plan: Plan is for patient to return home with self care when medically stable. CM will meet with him to establish PCP. CM following.  Expected Discharge Date:  08/08/17               Expected Discharge Plan:  Home/Self Care  In-House Referral:     Discharge planning Services  CM Consult  Post Acute Care Choice:    Choice offered to:     DME Arranged:    DME Agency:     HH Arranged:    HH Agency:     Status of Service:  In process, will continue to follow  If discussed at Long Length of Stay Meetings, dates discussed:    Additional Comments:  Kermit BaloKelli F Nashonda Limberg, RN 08/06/2017, 3:50 PM

## 2017-08-06 NOTE — Progress Notes (Signed)
CRITICAL VALUE ALERT  Critical Value: Norrvirus  Date & Time Notied:  08/06/17 1715  Provider Notified: Dr Benjamine MolaVann  Orders Received/Actions taken:

## 2017-08-06 NOTE — Progress Notes (Signed)
Patient admitted from ED, S/P new onset seizure. One witnessed in room upon arrival to floor. Alert and oriented to 4, patient is lethargic and drowsy. Partner at bedside.  IV in R AC with NS at 75cc/hr.  General and seizure care plans started. Oriented to nurse call bell and room.  Safety maintained.

## 2017-08-06 NOTE — Progress Notes (Signed)
PROGRESS NOTE    Daniel Peters  ONG:[id number] DOB: 02-12-1996 DOA: 08/05/2017 PCP: Patient, No Pcp Per   Outpatient Specialists:     Brief Narrative:  Daniel Peters is a 21 y.o. male with no medical past history who presents with seizure like activity. He had 2 witnessed episodes in the ED.   Per fiance, he was complaining of abdominal pain, diarrhea, chest pain.  He has had several days of watery diarrhea.  No sick contacts.  During EEG had spell compatible with non-epileptic seizures.  His indirect bilirubin was also found to be elevated.   Assessment & Plan:   Active Problems:   Convulsion, non-epileptic (HCC)   Dehydration   Diarrhea   Elevated bilirubin   Non-epileptic convulsion -initial episode sounds like syncope with seizure like activity but episode since do not correspond -no seizure medications -neurology has signed off  Dehydration due to diarrhea -IVF -GI pathogen panel  Elevated bilirubin -trend -RUQ U/S: Increased echogenicity of the liver, which may be seen with hepatic steatosis or fibrosis.     DVT prophylaxis:  Lovenox   Code Status: Full Code   Family Communication:   Disposition Plan:     Consultants:   neuro   Subjective: No SOB, no CP  Objective: Vitals:   08/05/17 2033 08/06/17 0144 08/06/17 0515 08/06/17 1001  BP: (!) 115/57 114/70 106/65 103/61  Pulse: (!) 113 (!) 108 90 96  Resp: 17 16 18 18   Temp: 97.6 F (36.4 C) 99.5 F (37.5 C) 98.9 F (37.2 C) 98.6 F (37 C)  TempSrc: Axillary Oral Oral Oral  SpO2: 99% 96% 98% 98%  Weight:      Height:        Intake/Output Summary (Last 24 hours) at 08/06/2017 1228 Last data filed at 08/06/2017 1210 Gross per 24 hour  Intake 1211.25 ml  Output 550 ml  Net 661.25 ml   Filed Weights   08/05/17 1400  Weight: 98.4 kg (217 lb)    Examination:  General exam: Anxious appearing Respiratory system: Clear to auscultation. Respiratory effort normal. Cardiovascular system:  S1 & S2 heard, RRR. No JVD, murmurs, rubs, gallops or clicks. No pedal edema. Gastrointestinal system: Abdomen is nondistended, soft and nontender. No organomegaly or masses felt. Normal bowel sounds heard. Central nervous system: Alert and oriented. No focal neurological deficits. Extremities: Symmetric 5 x 5 power.     Data Reviewed: I have personally reviewed following labs and imaging studies  CBC: Recent Labs  Lab 08/05/17 1407 08/06/17 0641  WBC 12.4* 6.7  HGB 16.1 13.8  HCT 45.4 40.9  MCV 88.3 88.7  PLT 200 198   Basic Metabolic Panel: Recent Labs  Lab 08/05/17 1407 08/06/17 0641  NA 137 139  K 4.7 3.4*  CL 102 106  CO2 25 23  GLUCOSE 114* 96  BUN 19 17  CREATININE 1.26* 1.21  CALCIUM 9.4 8.8*   GFR: Estimated Creatinine Clearance: 110.8 mL/min (by C-G formula based on SCr of 1.21 mg/dL). Liver Function Tests: Recent Labs  Lab 08/05/17 1407 08/06/17 0641  AST 47* 35  ALT 97* 72*  ALKPHOS 92 66  BILITOT 2.5* 3.2*  PROT 8.4* 6.6  ALBUMIN 4.9 4.1   Recent Labs  Lab 08/05/17 1407  LIPASE 24   No results for input(s): AMMONIA in the last 168 hours. Coagulation Profile: No results for input(s): INR, PROTIME in the last 168 hours. Cardiac Enzymes: Recent Labs  Lab 08/05/17 1920 08/06/17 0015 08/06/17 0641  TROPONINI <0.03 <  0.03 <0.03   BNP (last 3 results) No results for input(s): PROBNP in the last 8760 hours. HbA1C: No results for input(s): HGBA1C in the last 72 hours. CBG: Recent Labs  Lab 08/05/17 1503  GLUCAP 95   Lipid Profile: No results for input(s): CHOL, HDL, LDLCALC, TRIG, CHOLHDL, LDLDIRECT in the last 72 hours. Thyroid Function Tests: No results for input(s): TSH, T4TOTAL, FREET4, T3FREE, THYROIDAB in the last 72 hours. Anemia Panel: No results for input(s): VITAMINB12, FOLATE, FERRITIN, TIBC, IRON, RETICCTPCT in the last 72 hours. Urine analysis: No results found for: COLORURINE, APPEARANCEUR, LABSPEC, PHURINE, GLUCOSEU,  HGBUR, BILIRUBINUR, KETONESUR, PROTEINUR, UROBILINOGEN, NITRITE, LEUKOCYTESUR   )No results found for this or any previous visit (from the past 240 hour(s)).    Anti-infectives (From admission, onward)   None       Radiology Studies: Ct Head Wo Contrast  Result Date: 08/05/2017 CLINICAL DATA:  Seizure.  New.  Nontraumatic. EXAM: CT HEAD WITHOUT CONTRAST TECHNIQUE: Contiguous axial images were obtained from the base of the skull through the vertex without intravenous contrast. COMPARISON:  None. FINDINGS: Brain: EEG leads creates some degree of artifact. No acute infarct, hemorrhage, or mass lesion is present. The ventricles are of normal size. No significant extraaxial fluid collection is present. Vascular: No hyperdense vessel or unexpected calcification. Skull: The calvarium is intact. No focal lytic or blastic lesions are present. Sinuses/Orbits: The paranasal sinuses and mastoid air cells are clear. The globes and orbits are within normal limits. IMPRESSION: Negative CT of the head. Electronically Signed   By: Marin Robertshristopher  Mattern M.D.   On: 08/05/2017 19:19   Ct Angio Chest Pe W Or Wo Contrast  Result Date: 08/05/2017 CLINICAL DATA:  Shortness of breath. EXAM: CT ANGIOGRAPHY CHEST WITH CONTRAST TECHNIQUE: Multidetector CT imaging of the chest was performed using the standard protocol during bolus administration of intravenous contrast. Multiplanar CT image reconstructions and MIPs were obtained to evaluate the vascular anatomy. CONTRAST:  59 cc Isovue 370 intravenously COMPARISON:  None. FINDINGS: Cardiovascular: Satisfactory opacification of the pulmonary arteries to the segmental level. No evidence of pulmonary embolism. Normal heart size. No pericardial effusion. Mediastinum/Nodes: No enlarged mediastinal, hilar, or axillary lymph nodes. Thyroid gland, trachea, and esophagus demonstrate no significant findings. Lungs/Pleura: Lungs are clear. No pleural effusion or pneumothorax.  Hypoventilatory changes with dependent distribution. Prominence of the interstitial markings may be seen with interstitial pulmonary edema. Upper Abdomen: No acute abnormality. Musculoskeletal: No chest wall abnormality. No acute or significant osseous findings. Review of the MIP images confirms the above findings. IMPRESSION: No evidence of pulmonary embolus or other significant vascular findings. Hypoventilatory changes. Possible development of interstitial pulmonary edema. Electronically Signed   By: Ted Mcalpineobrinka  Dimitrova M.D.   On: 08/05/2017 19:26   [place] Abdomen Limited Ruq  Result Date: 08/06/2017 CLINICAL DATA:  Abnormal transaminases. EXAM: ULTRASOUND ABDOMEN LIMITED RIGHT UPPER QUADRANT COMPARISON:  Body CT 12/21/2013 FINDINGS: Gallbladder: No gallstones or wall thickening visualized. No sonographic Murphy sign noted by sonographer. Common bile duct: Diameter: 3.3 mm Liver: No focal lesion identified. Increased parenchymal echogenicity. Portal vein is patent on color Doppler imaging with normal direction of blood flow towards the liver. IMPRESSION: Normal appearance of the urinary bladder. Increased echogenicity of the liver, which may be seen with hepatic steatosis or fibrosis. Electronically Signed   By: Ted Mcalpineobrinka  Dimitrova M.D.   On: 08/06/2017 00:05        Scheduled Meds: . enoxaparin (LOVENOX) injection  40 mg Subcutaneous Q24H  . [START ON  08/07/2017] Influenza vac split quadrivalent PF  0.5 mL Intramuscular Tomorrow-1000  . pantoprazole (PROTONIX) IV  40 mg Intravenous Q12H  . potassium chloride  40 mEq Oral Once   Continuous Infusions:   LOS: 0 days    Time spent: 35 min    Joseph ArtJessica U Abdulah Iqbal, DO [facility] Pager [phone]  If 7PM-7AM, please contact night-coverage [email or web address] Password [email or web address] 08/06/2017, 12:28 PM

## 2017-08-06 NOTE — Procedures (Signed)
LTM-EEG Report  HISTORY: Continuous video-EEG monitoring performed for 21 year old with new onset seizure-like events.  ACQUISITION: International 10-20 system for electrode placement; 18 channels with additional eyes linked to ipsilateral ears and EKG. Additional T1-T2 electrodes were used. Continuous video recording obtained.   EEG NUMBER:  MEDICATIONS:  Day 1: no AED   DAY #1: from 1732 08/05/17 to 0730 08/06/17  BACKGROUND: An overall medium voltage continuous recording with good spontaneous variability and reactivity. Waking background consisted of a medium voltage 10 Hz posterior dominant rhythm bilaterally with low voltage beta activity in the bilateral frontocentral regions and some medium voltage theta activity diffusely. Sleep was captured with normal stage II sleep architecture.  EPILEPTIFORM/PERIODIC ACTIVITY: none  SEIZURES: none  EVENTS: There was one event at 1824 of eye opening, unresponsiveness, and non-rhythmic shaking movements of the limbs and torso with back arching. There was a normal waking EEG background during this event without epileptiform discharges. 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  EKG: no significant arrhythmia  SUMMARY: This was a normal continuous video EEG with no epileptiform discharges or lateralizing signs. There was one clinical event of interest captured at 1824 with shaking and unresponsiveness consistent with a psychogenic non-epileptic spell.

## 2017-08-06 NOTE — Progress Notes (Addendum)
Neurology sign off note  Subjective:  No complaints   Vitals:   08/06/17 0144 08/06/17 0515  BP: 114/70 106/65  Pulse: (!) 108 90  Resp: 16 18  Temp: 99.5 F (37.5 C) 98.9 F (37.2 C)  SpO2: 96% 98%   Gen: In bed, NAD Resp: non-labored breathing, no acute distress Abd: soft, nt   General: NAD Mental Status: Alert, oriented, thought content appropriate.  Speech fluent without evidence of aphasia.  Able to follow 3 step commands without difficulty. Cranial Nerves: II:  Visual fields grossly normal, pupils equal, round, reactive to light and accommodation III,IV, VI: ptosis not present, extra-ocular motions intact bilaterally V,VII: smile symmetric, facial light touch sensation normal bilaterally VIII: hearing normal bilaterally IX,X: uvula rises symmetrically XI: bilateral shoulder shrug XII: midline tongue extension without atrophy or fasciculations  Motor: Right : Upper extremity   5/5    Left:     Upper extremity   5/5  Lower extremity   5/5     Lower extremity   5/5 Tone and bulk:normal tone throughout; no atrophy noted Sensory: Pinprick and light touch intact throughout, bilaterally Deep Tendon Reflexes:  Right: Upper Extremity   Left: Upper extremity   biceps (C-5 to C-6) 2/4   biceps (C-5 to C-6) 2/4 tricep (C7) 2/4    triceps (C7) 2/4 Brachioradialis (C6) 2/4  Brachioradialis (C6) 2/4  Lower Extremity Lower Extremity  quadriceps (L-2 to L-4) 2/4   quadriceps (L-2 to L-4) 2/4 Achilles (S1) 2/4   Achilles (S1) 2/4  Plantars: Right: downgoing   Left: downgoing Cerebellar: normal finger-to-nose,  normal heel-to-shin test     Pertinent Labs:  Potassium 3.4 Calcium 8.8 ALT 72 Total bilirubin 3.2 Head CT negative LTM showed 1 shaking episode which was nonepileptic  Brief summary of case/Impression:  21 year old male presenting to the emergency department with a flurry of seizures.  Total number of seizures prior to hospitalization was 4.  Upon  entering the emergency department had another 2 seizures.  Patient was hooked up to LTM and overnight he had one further seizure which was nonepileptic.  Patient admits to being under significant stress recently.  At this time would not start an antiepileptic medication.  Neurological Diagnoses: 1) Probable Non epileptic spells, most likely due to findings on EEG. IF continues to occur would recommend LTM to capture more of the events to confirm these are non-epileptic spells as we only captured one spell.   Recommendations: 1) would not start antiepileptic medications at this time.  Would recommend patient possibly seeing a psychologist as an outpatient for stress relief. 2) [place] [facility] [place], patients with seizures are not allowed to drive until  they have been seizure-free for six months. Use caution when using heavy equipment or power tools. Avoid working on ladders or at heights. Take showers instead of baths. Ensure the water temperature is not too high on the home water heater. Do not go swimming alone. When caring for infants or small children, sit down when holding, feeding, or changing them to minimize risk of injury to the child in the event you have a seizure.   Also, Maintain good sleep hygiene. Avoid alcohol.  --> Call 911 and bring the patient back to the ED if:                   A.  The seizure lasts longer than 5 minutes.  B.  The patient doesn't awaken shortly after the seizure             C.  The patient has new problems such as difficulty seeing, speaking or moving             D.  The patient was injured during the seizure             E.  The patient has a temperature over 102 F (39C)             F.  The patient vomited and now is having trouble breathing   3) Neurology to sign off at this time. Please call with any further questions or concerns.    NEUROHOSPITALIST ADDENDUM Seen and examined the patient this AM. Formulated plan as  documented above. Recommendations as above.  [age] y patient with no prior seizures or family history of seizures - presents with multiple spells. 3-4 spells at work - patient remembers hearing other people speak while shaking. Had 2 more spells in ED witnessed by fiance and ER, jerking of all extremities with eyes uprolling but then closed. No tongue bite/urinary incontinence. Short post ictal period.  VEEG connected, had recorded episodes at 6.30 pm with non rhythmic shaking with no EEG correlated. This was a non epileptic event.  I cant say for sure events that occurred initially were also non epileptic, however based on description that he could remember shaking and having them and and 1 captured episode favors non epileptic events. Will not start on AED.He has been under stress. [facility]. No driving x 6 months.   Georgiana SpinnerSushanth Ferlando Lia MD [facility] [phone]  If 7pm to 7am, please call on call as listed on AMION.

## 2017-08-06 NOTE — Progress Notes (Signed)
Discharge instructions, RX's and follow up appts explained and provided to patient verbalized understanding. Patient left floor via wheelchair accompanied by staff no c/o pain at d/c.  Alexea Blase Lynn, RN  

## 2017-08-07 LAB — HEPATITIS PANEL, ACUTE
HEP A IGM: NEGATIVE
HEP B C IGM: NEGATIVE
HEP B S AG: NEGATIVE

## 2020-02-21 ENCOUNTER — Emergency Department (HOSPITAL_COMMUNITY): Payer: 59

## 2020-02-21 ENCOUNTER — Encounter (HOSPITAL_COMMUNITY): Payer: Self-pay | Admitting: Emergency Medicine

## 2020-02-21 ENCOUNTER — Emergency Department (HOSPITAL_COMMUNITY)
Admission: EM | Admit: 2020-02-21 | Discharge: 2020-02-21 | Disposition: A | Discharge status: Home / Self Care | Payer: 59 | Attending: Emergency Medicine | Admitting: Emergency Medicine

## 2020-02-21 DIAGNOSIS — S161XXA Strain of muscle, fascia and tendon at neck level, initial encounter: Secondary | ICD-10-CM | POA: Insufficient documentation

## 2020-02-21 DIAGNOSIS — Y998 Other external cause status: Secondary | ICD-10-CM | POA: Diagnosis not present

## 2020-02-21 DIAGNOSIS — M542 Cervicalgia: Secondary | ICD-10-CM | POA: Diagnosis present

## 2020-02-21 DIAGNOSIS — Y9389 Activity, other specified: Secondary | ICD-10-CM | POA: Insufficient documentation

## 2020-02-21 DIAGNOSIS — Y9289 Other specified places as the place of occurrence of the external cause: Secondary | ICD-10-CM | POA: Insufficient documentation

## 2020-02-21 HISTORY — DX: Disorder of thyroid, unspecified: E07.9
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
